# Patient Record
Sex: Female | Born: 1995 | Race: White | Hispanic: No | Marital: Single | Smoking: Never smoker
Health system: Southern US, Community
[De-identification: ages and names within clinical notes are randomized; demographics above are authoritative.]

---
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
## 2020-06-13 ENCOUNTER — Other Ambulatory Visit: Payer: Self-pay

## 2020-06-13 ENCOUNTER — Emergency Department (HOSPITAL_COMMUNITY): Payer: Medicaid Other

## 2020-06-13 ENCOUNTER — Encounter (HOSPITAL_COMMUNITY): Payer: Self-pay

## 2020-06-13 ENCOUNTER — Emergency Department (HOSPITAL_COMMUNITY)
Admission: EM | Admit: 2020-06-13 | Discharge: 2020-06-14 | Disposition: A | Discharge status: Home / Self Care | Payer: Medicaid Other | Attending: Emergency Medicine | Admitting: Emergency Medicine

## 2020-06-13 DIAGNOSIS — R599 Enlarged lymph nodes, unspecified: Secondary | ICD-10-CM

## 2020-06-13 DIAGNOSIS — R000 Tachycardia, unspecified: Secondary | ICD-10-CM | POA: Insufficient documentation

## 2020-06-13 DIAGNOSIS — R079 Chest pain, unspecified: Secondary | ICD-10-CM | POA: Diagnosis present

## 2020-06-13 DIAGNOSIS — R791 Abnormal coagulation profile: Secondary | ICD-10-CM | POA: Insufficient documentation

## 2020-06-13 LAB — BASIC METABOLIC PANEL
Anion gap: 7 (ref 5–15)
BUN: 14 mg/dL (ref 6–20)
CO2: 24 mmol/L (ref 22–32)
Calcium: 9.5 mg/dL (ref 8.9–10.3)
Chloride: 107 mmol/L (ref 98–111)
Creatinine, Ser: 0.87 mg/dL (ref 0.44–1.00)
GFR, Estimated: >60 mL/min (ref >60)
Glucose, Bld: 88 mg/dL (ref 70–99)
Potassium: 4 mmol/L (ref 3.5–5.1)
Sodium: 138 mmol/L (ref 135–145)

## 2020-06-13 LAB — CBC
HCT: 43.7 % (ref 36.0–46.0)
Hemoglobin: 13.7 g/dL (ref 12.0–15.0)
MCH: 28.7 pg (ref 26.0–34.0)
MCHC: 31.4 g/dL (ref 30.0–36.0)
MCV: 91.6 fL (ref 80.0–100.0)
Platelets: 242 10*3/uL (ref 150–400)
RBC: 4.77 MIL/uL (ref 3.87–5.11)
RDW: 13.5 % (ref 11.5–15.5)
WBC: 7.1 10*3/uL (ref 4.0–10.5)
nRBC: 0 % (ref 0.0–0.2)

## 2020-06-13 LAB — I-STAT BETA HCG BLOOD, ED (MC, WL, AP ONLY): I-stat hCG, quantitative: <5 m[IU]/mL (ref <5)

## 2020-06-13 LAB — D-DIMER, QUANTITATIVE: D-Dimer, Quant: 0.91 ug/mL-FEU — ABNORMAL HIGH (ref 0.00–0.50)

## 2020-06-13 LAB — TROPONIN I (HIGH SENSITIVITY): Troponin I (High Sensitivity): 13 ng/L (ref <18)

## 2020-06-13 NOTE — ED Triage Notes (Signed)
Left sided chest pain radiating to the back. Denies any nausea, vomiting and shortness of breath. Also was told by PCP that DDimer was elevated and was told to come to ED asap.

## 2020-06-13 NOTE — ED Provider Notes (Signed)
[facility] EMERGENCY DEPARTMENT Provider Note   CSN: [id number] Arrival date & time: 06/13/20  2048     History Chief Complaint  Patient presents with  . Chest Pain  . abnormal labs    Kendra Wallace is a 25 y.o. female.  25 year old female without significant medical history presents emerged department today secondary to chest pain and elevated D-dimer.  Sounds the patient has multiple symptoms that her doctors been working her up for to include joint pain, edema, tachycardia, chest pain.  Part of that work-up was receiving a D-dimer today which was elevated so was sent here for CT scan.  She denies any shortness of breath at this time.  No recent fevers or coughs.  She states her family has a history of arthritis but is unclear what type.  States her joint pain seems to be worse in the morning and can be sometimes associated with swelling but there is no pattern to it.        History reviewed. No pertinent past medical history.  There are no problems to display for this patient.   History reviewed. No pertinent surgical history.   OB History   No obstetric history on file.     History reviewed. No pertinent family history.  Social History   Tobacco Use  . Smoking status: Never Smoker  . Smokeless tobacco: Never Used  Vaping Use  . Vaping Use: Every day  Substance Use Topics  . Alcohol use: Yes  . Drug use: Never    Home Medications Prior to Admission medications   Not on File    Allergies    Patient has no known allergies.  Review of Systems   Review of Systems  All other systems reviewed and are negative.   Physical Exam Updated Vital Signs BP (!) 143/80 (BP Location: Right Arm)   Pulse (!) 101   Temp 98.3 F (36.8 C)   Resp 14   Ht 5\' 3"  (1.6 m)   Wt 111.1 kg   LMP 06/13/2020 (Exact Date)   SpO2 96%   BMI 43.40 kg/m   Physical Exam Vitals and nursing note reviewed.  Constitutional:      Appearance: She is  well-developed.  HENT:     Head: Normocephalic and atraumatic.  Cardiovascular:     Rate and Rhythm: Regular rhythm. Tachycardia present.  Pulmonary:     Effort: No respiratory distress.     Breath sounds: No stridor.  Abdominal:     General: There is no distension.  Musculoskeletal:        General: Normal range of motion.     Cervical back: Normal range of motion.     Right lower leg: No tenderness. No edema.     Left lower leg: No tenderness. No edema.  Skin:    General: Skin is warm and dry.  Neurological:     General: No focal deficit present.     Mental Status: She is alert.     ED Results / Procedures / Treatments   Labs (all labs ordered are listed, but only abnormal results are displayed) Labs Reviewed  D-DIMER, QUANTITATIVE - Abnormal; Notable for the following components:      Result Value   D-Dimer, Quant 0.91 (*)    All other components within normal limits  BASIC METABOLIC PANEL  CBC  I-STAT BETA HCG BLOOD, ED ([facility], [facility], [facility] ONLY)  TROPONIN I (HIGH SENSITIVITY)  TROPONIN I (HIGH SENSITIVITY)    EKG None  Radiology DG Chest 2 View  Result Date: 06/13/2020 CLINICAL DATA:  Left-sided chest pain radiating to back. EXAM: CHEST - 2 VIEW COMPARISON:  None. FINDINGS: The heart size and mediastinal contours are within normal limits. Both lungs are clear. The visualized skeletal structures are unremarkable. IMPRESSION: Normal exam. Electronically Signed   By: Danae Orleans M.D.   On: 06/13/2020 21:59    Procedures Procedures   Medications Ordered in ED Medications - No data to display  ED Course  I have reviewed the triage vital signs and the nursing notes.  Pertinent labs & imaging results that were available during my care of the patient were reviewed by me and considered in my medical decision making (see chart for details).    MDM Rules/Calculators/A&P                          With tachycardia, chest pain and elevated D-dimer felt CT scan is warranted.   I do think that some of her tachycardia is probably baseline and is why she has a monitor from her PCP.  I think if the CT is negative rest of her labs are unremarkable she can be discharged to continue follow-up and work-up through her primary doctors. Final Clinical Impression(s) / ED Diagnoses Final diagnoses:  None    Rx / DC Orders ED Discharge Orders    None       Floyd Lusignan, Barbara Cower, MD 06/14/20 [phone]

## 2020-06-14 ENCOUNTER — Emergency Department (HOSPITAL_COMMUNITY): Payer: Medicaid Other

## 2020-06-14 LAB — TROPONIN I (HIGH SENSITIVITY): Troponin I (High Sensitivity): 13 ng/L (ref <18)

## 2020-06-14 MED ORDER — IOHEXOL 350 MG/ML SOLN
100.0000 mL | Freq: Once | INTRAVENOUS | Status: AC | PRN
  Administered 2020-06-14: 100 mL via INTRAVENOUS

## 2022-04-13 IMAGING — DX DG CHEST 2V
2 series · 2 of 2 positions shown · non-contrast
Comparison: None.

CLINICAL DATA: Left-sided chest pain radiating to back.

EXAM:
CHEST - 2 VIEW

[w chest pa]
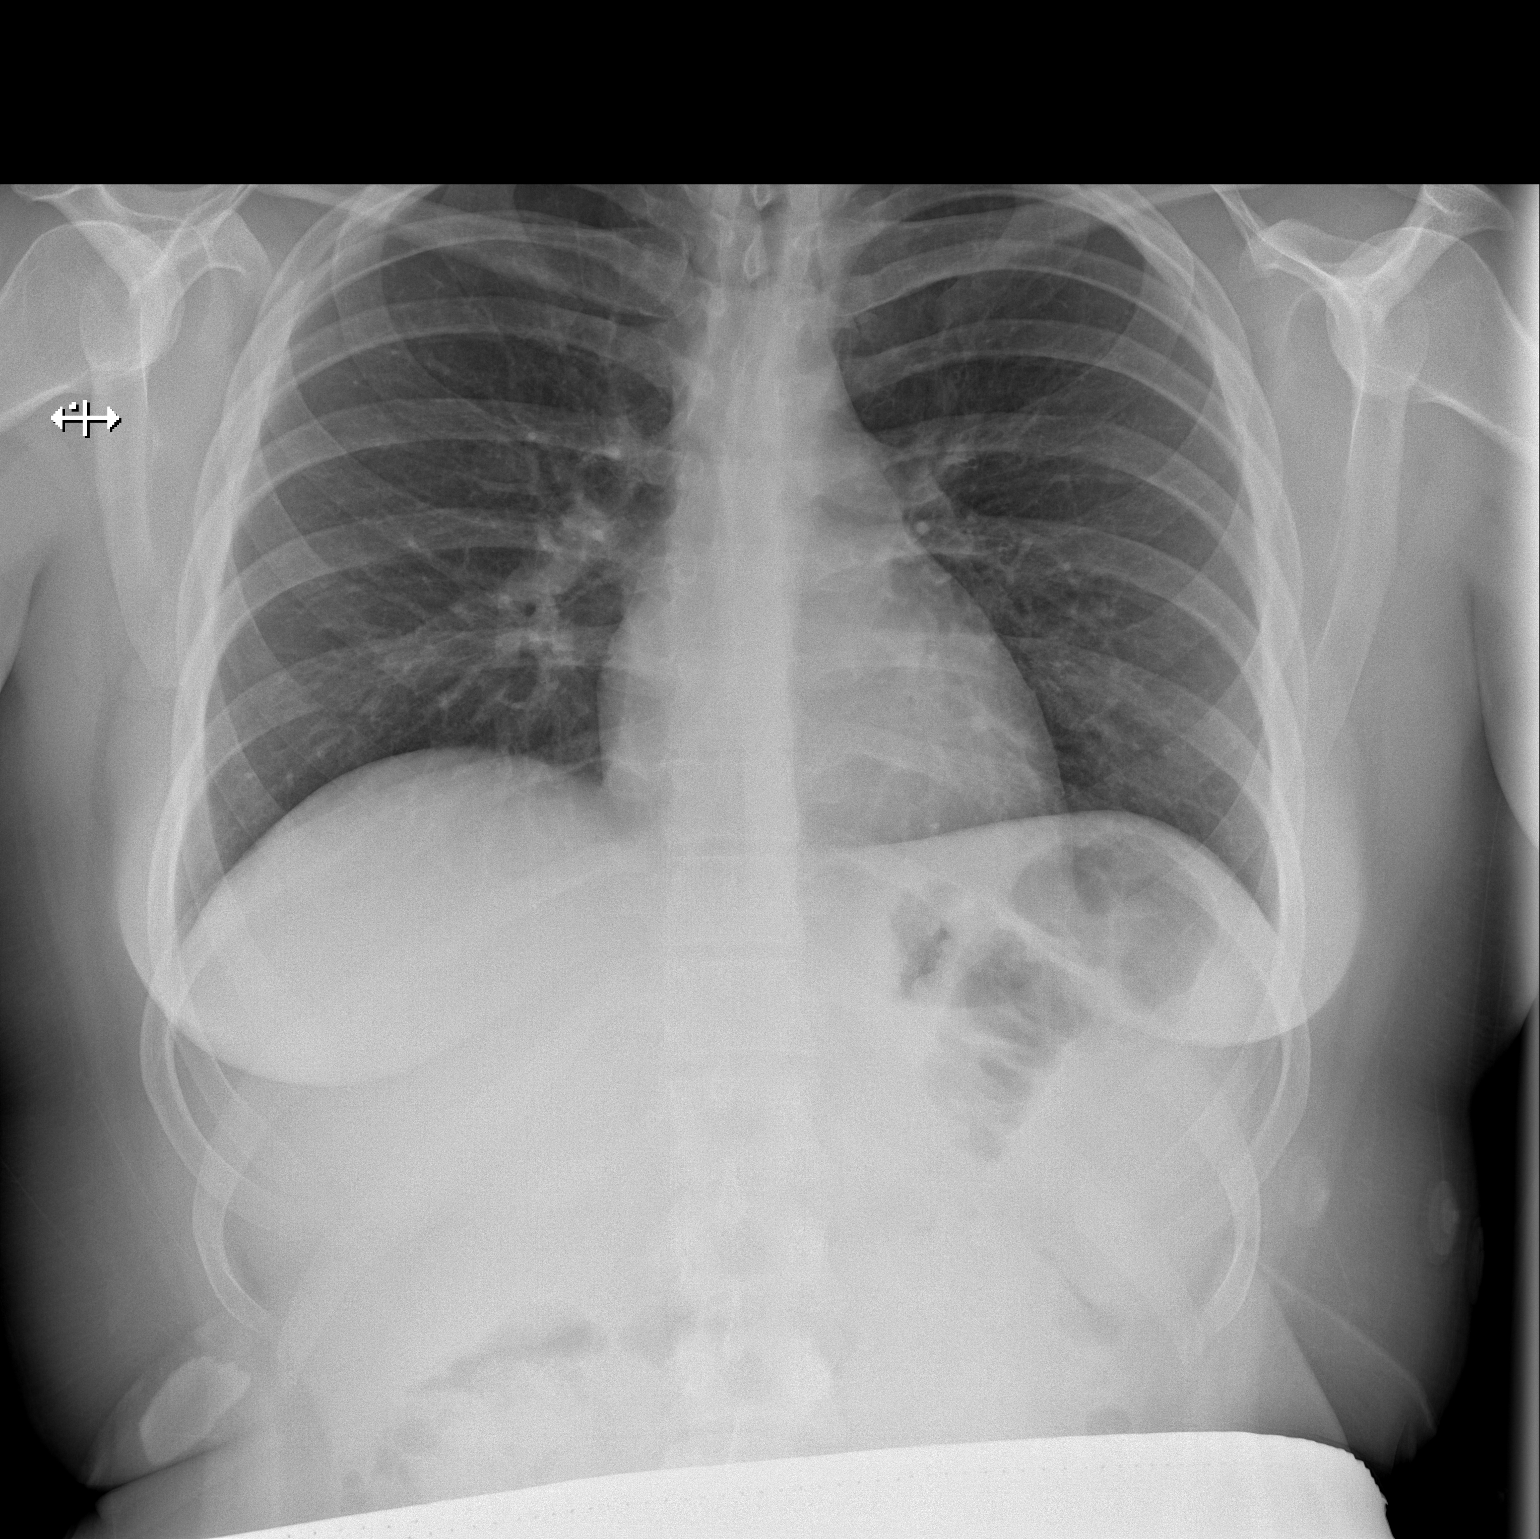

[w chest lat]
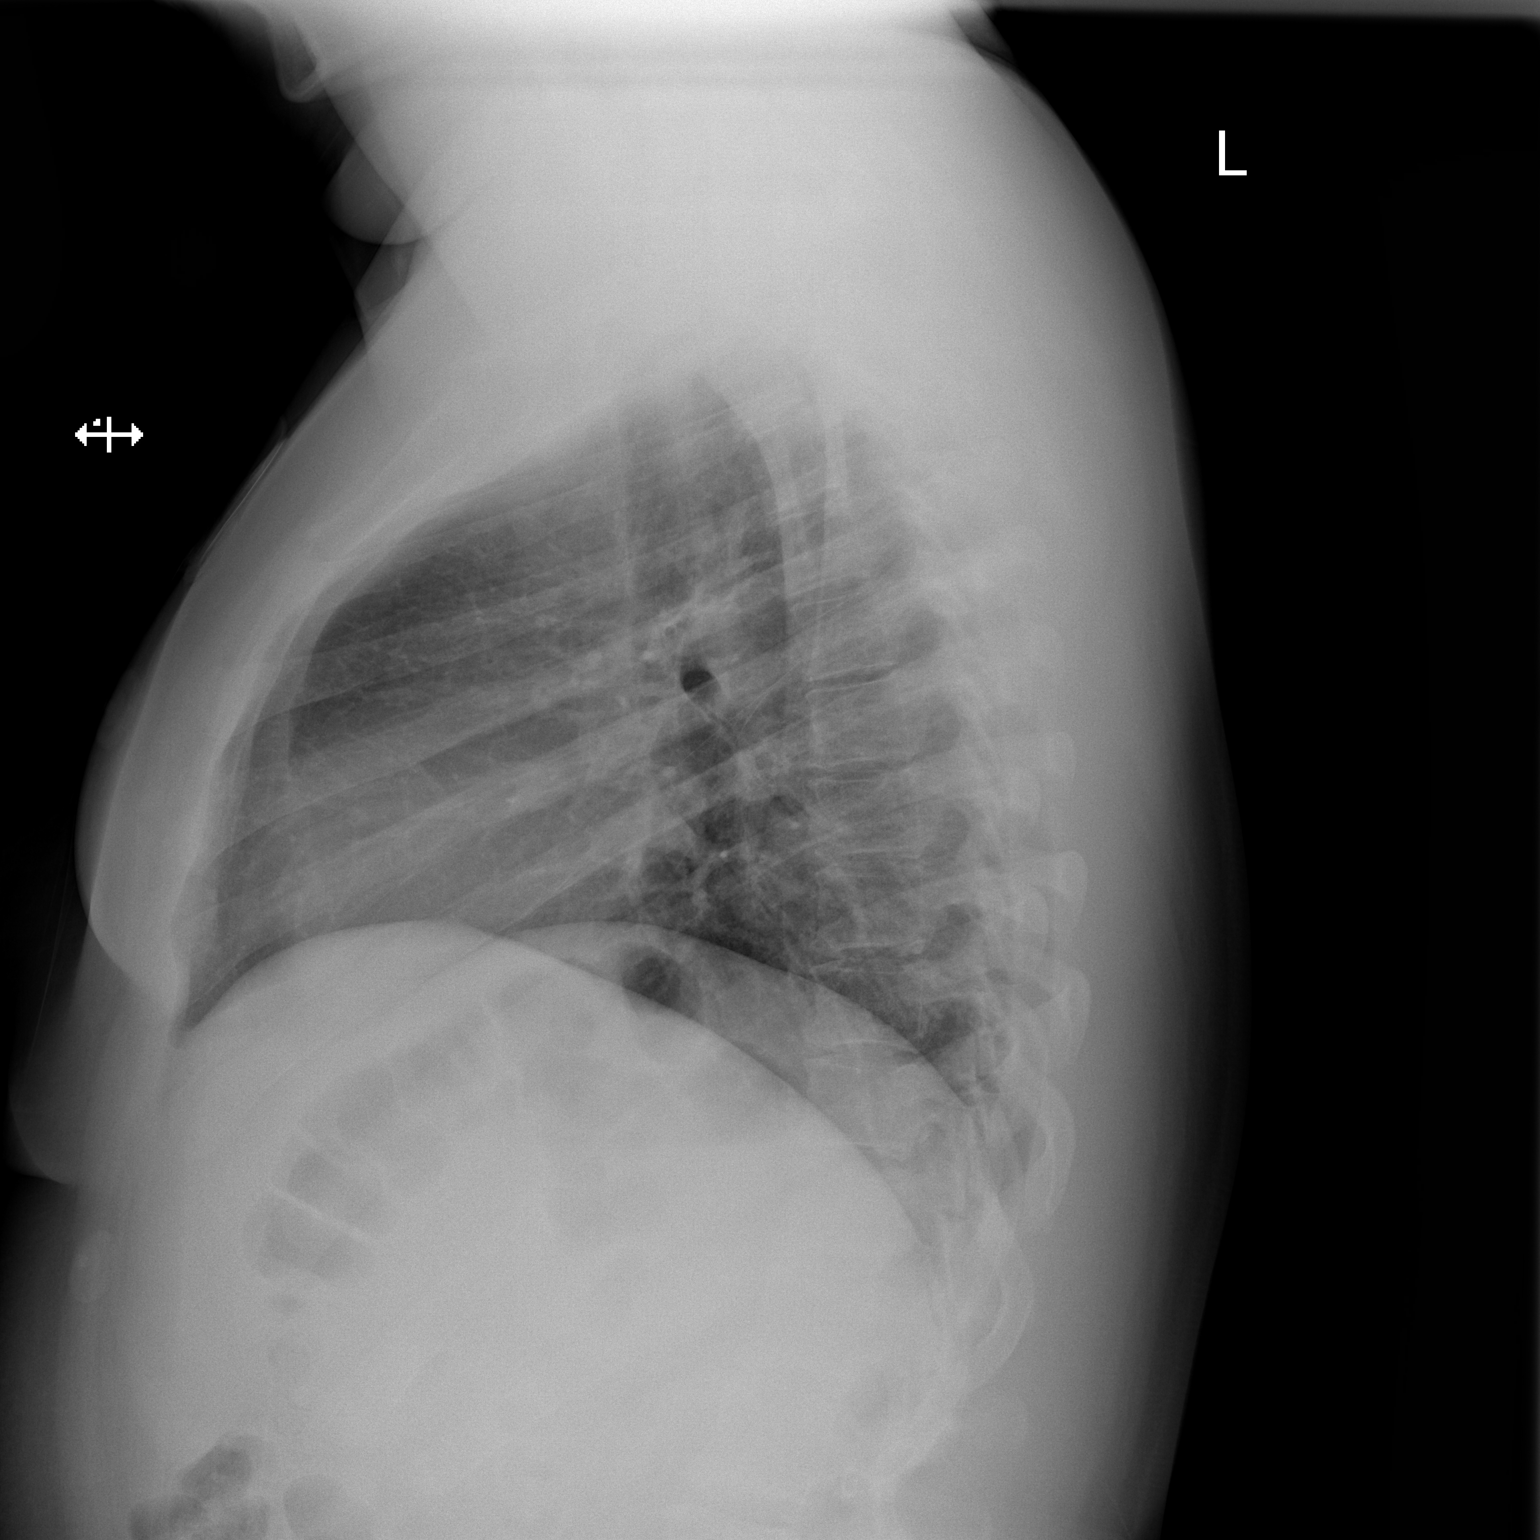

[2 of 2 positions shown; findings below may reference images not displayed]

FINDINGS: The heart size and mediastinal contours are within normal limits.
Both lungs are clear. The visualized skeletal structures are
unremarkable.
IMPRESSION: Normal exam.

## 2022-04-14 IMAGING — CT CT ANGIO CHEST
2 of 6 series · 18 of 46 positions shown · IV contrast (APPLIED)
Comparison: Radiograph 06/13/2020

CLINICAL DATA: Chest discomfort, PE suspected

EXAM:
CT ANGIOGRAPHY CHEST WITH CONTRAST
TECHNIQUE: Multidetector CT imaging of the chest was performed using the
standard protocol during bolus administration of intravenous
contrast. Multiplanar CT image reconstructions and MIPs were
obtained to evaluate the vascular anatomy.
CONTRAST:  100mL OMNIPAQUE IOHEXOL 350 MG/ML SOLN

[Series 9: thins · axial · 0.87mm/px · z∈[+1084,+1350]mm · 15 of 417 slices shown]
[im 19/417  lung]
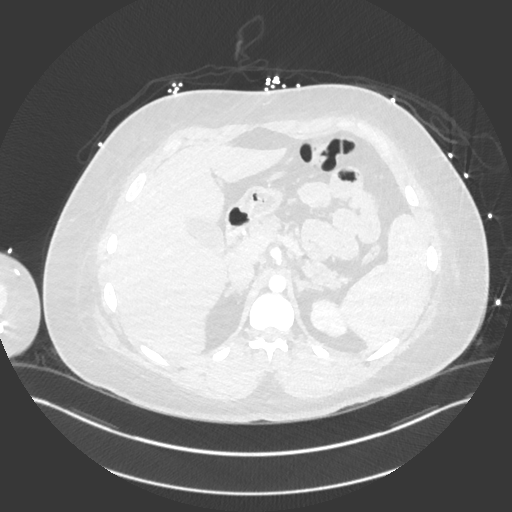
[im 55/417  soft-tissue]
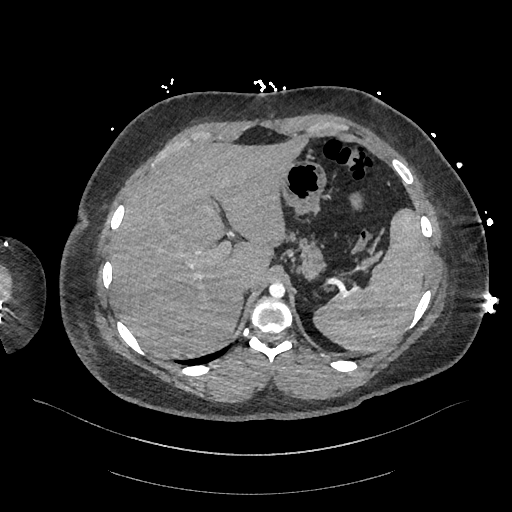
[im 73/417  lung]
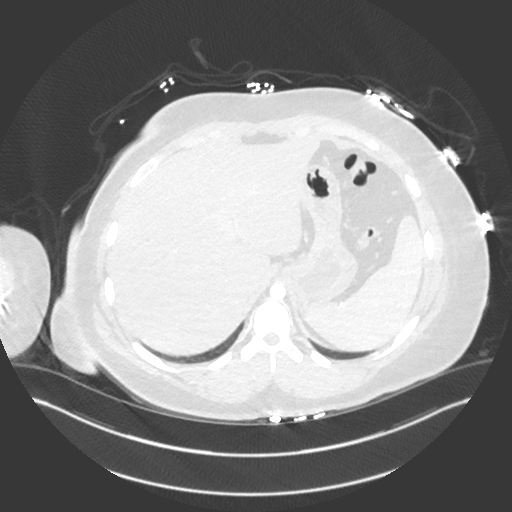
[im 109/417  soft-tissue]
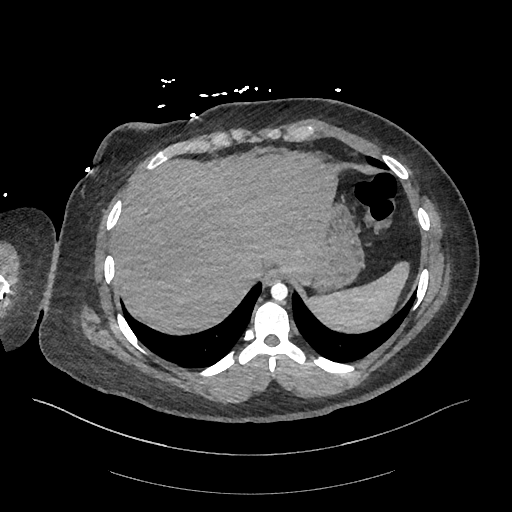
[im 127/417  lung]
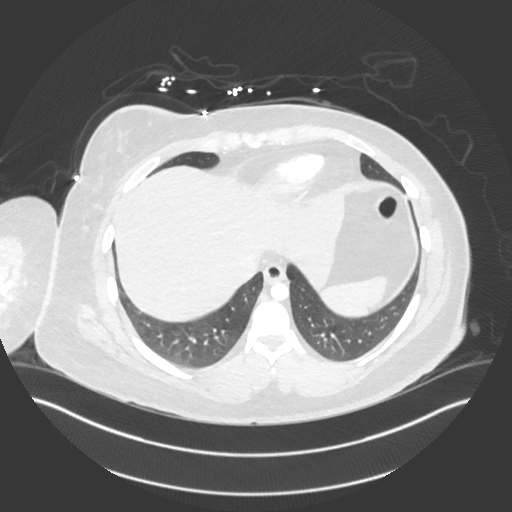
[im 163/417  soft-tissue]
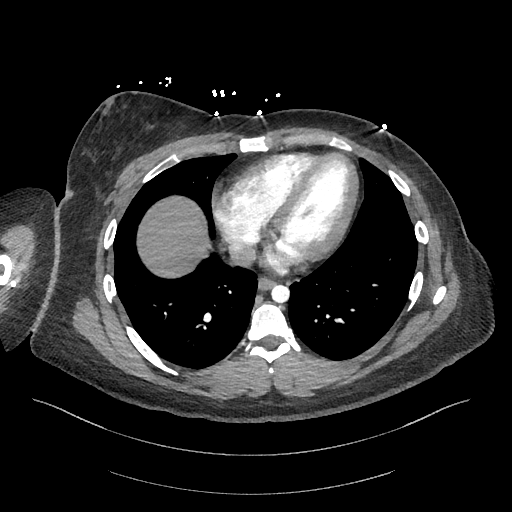
[im 181/417  lung]
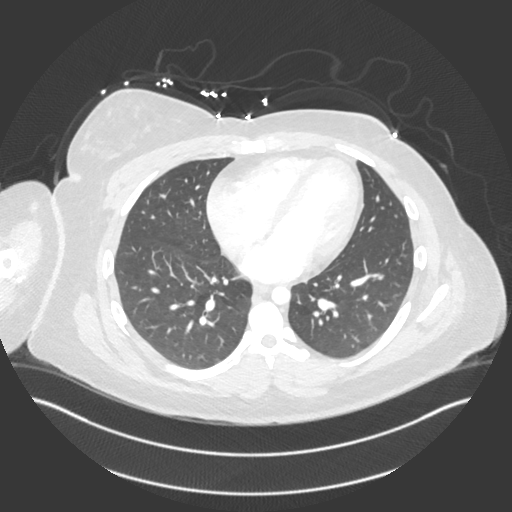
[im 218/417  soft-tissue]
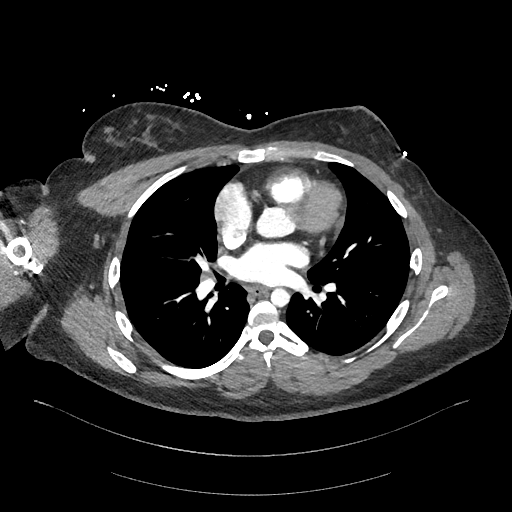
[im 236/417  lung]
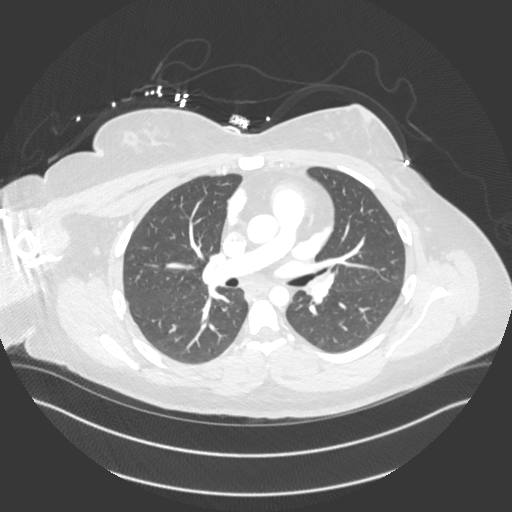
[im 254/417  soft-tissue]
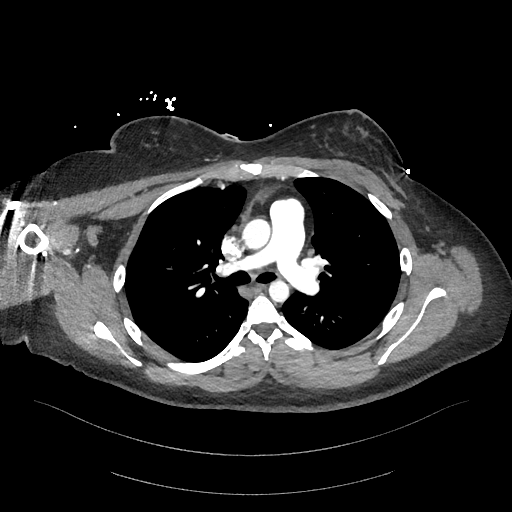
[im 290/417  lung]
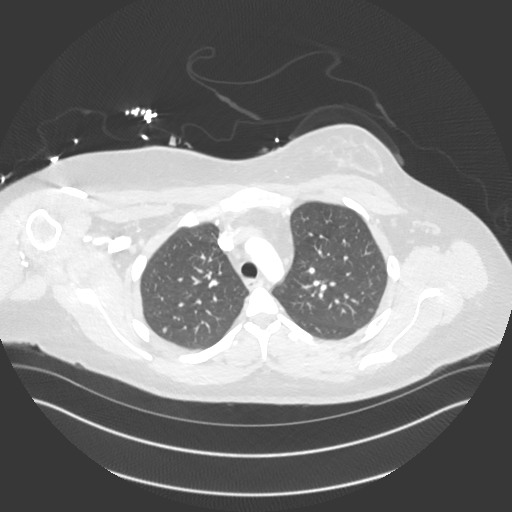
[im 308/417  soft-tissue]
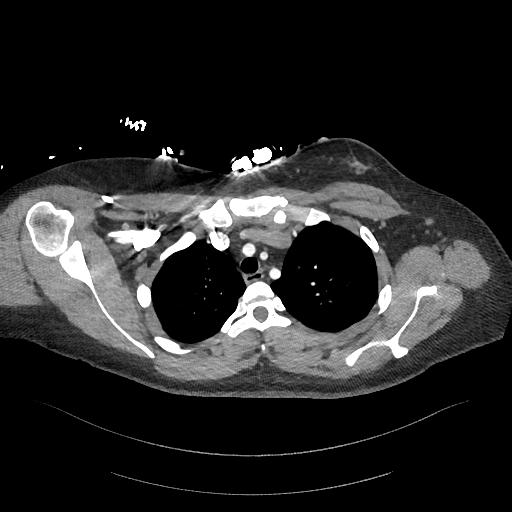
[im 344/417  lung]
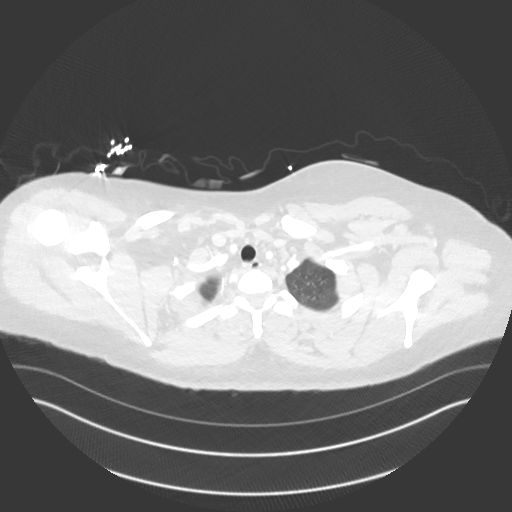
[im 362/417  soft-tissue]
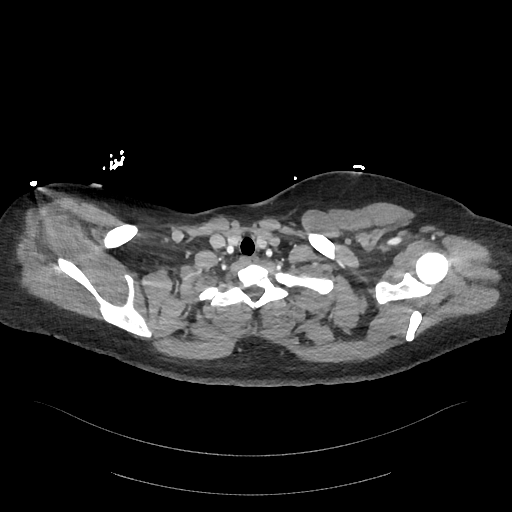
[im 398/417  lung]
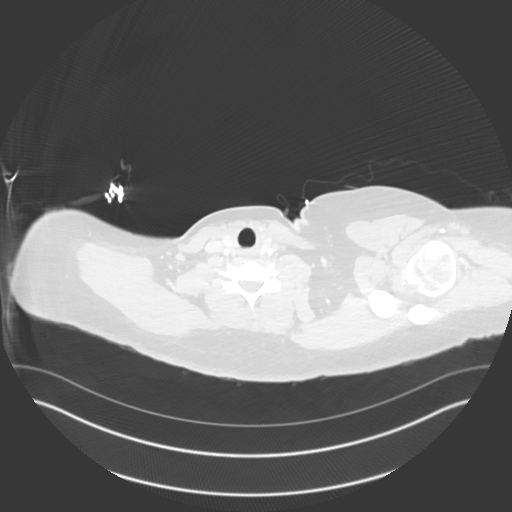

[Series 10: cor · coronal · 0.60mm/px · 3 of 146 slices shown]
[im 37/146  soft-tissue]
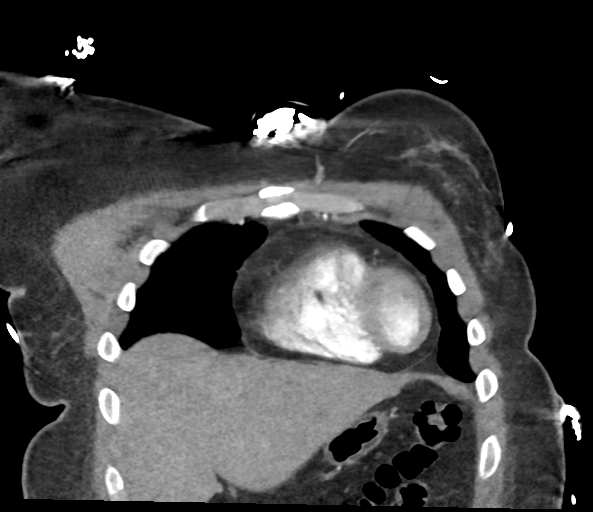
[im 73/146  soft-tissue]
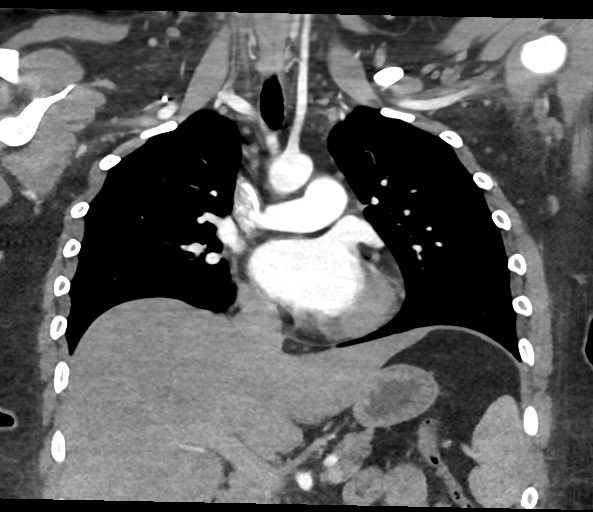
[im 109/146  soft-tissue]
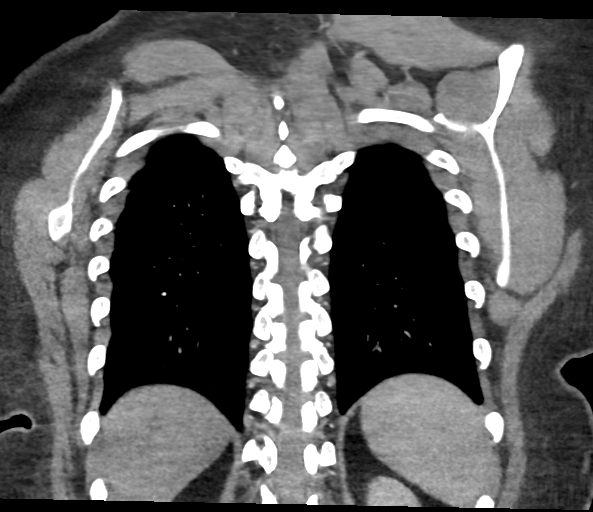

[18 of 46 positions shown; findings below may reference images not displayed]

FINDINGS: Cardiovascular: Satisfactory opacification the pulmonary arteries to
the segmental level. No pulmonary artery filling defects are
identified. Central pulmonary arteries are normal caliber. Normal
heart size. No pericardial effusion. The aorta is normal caliber. No
acute luminal abnormality of the imaged aorta. No periaortic
stranding or hemorrhage. Normal 3 vessel branching of the aortic
arch. Proximal great vessels are unremarkable.

Mediastinum/Nodes: No mediastinal fluid or gas. Normal thyroid gland
and thoracic inlet. No acute abnormality of the trachea or
esophagus. Borderline enlarged left axillary node ([DATE]) albeit with
preserved nodal architecture. No worrisome or enlarged mediastinal,
hilar or adenopathy.

Lungs/Pleura: No consolidation, features of edema, pneumothorax, or
effusion. No suspicious pulmonary nodules or masses. Airways patent.

Upper Abdomen: Diffuse hypoattenuation of the liver is nonspecific
given arterial phase exam of imaging. Cannot exclude hepatic
steatosis. No acute abnormalities present in the visualized portions
of the upper abdomen.

Musculoskeletal: No acute osseous abnormality or suspicious osseous
lesion. Mild body wall edema. No worrisome chest wall mass or
lesion.

Review of the MIP images confirms the above findings.
IMPRESSION: No evidence of pulmonary artery embolus.

No other acute cardiopulmonary abnormality.

Borderline enlarged left axillary lymph node with preserved
architecture, favor a reactive node. If palpable, recommend clinical
follow-up.
